# Patient Record
Sex: Male | Born: 1980 | Race: White | Hispanic: No | Marital: Married | Smoking: Never smoker
Health system: Southern US, Community
[De-identification: ages and names within clinical notes are randomized; demographics above are authoritative.]

## PROBLEM LIST (undated history)

## (undated) HISTORY — PX: OTHER SURGICAL HISTORY: SHX169

---

## 2021-01-12 ENCOUNTER — Emergency Department: Payer: Self-pay

## 2021-01-12 ENCOUNTER — Other Ambulatory Visit: Payer: Self-pay

## 2021-01-12 ENCOUNTER — Emergency Department
Admission: EM | Admit: 2021-01-12 | Discharge: 2021-01-12 | Disposition: A | Discharge status: Home / Self Care | Payer: Self-pay | Attending: Emergency Medicine | Admitting: Emergency Medicine

## 2021-01-12 DIAGNOSIS — S61214A Laceration without foreign body of right ring finger without damage to nail, initial encounter: Secondary | ICD-10-CM | POA: Insufficient documentation

## 2021-01-12 DIAGNOSIS — W208XXA Other cause of strike by thrown, projected or falling object, initial encounter: Secondary | ICD-10-CM | POA: Insufficient documentation

## 2021-01-12 DIAGNOSIS — S61216A Laceration without foreign body of right little finger without damage to nail, initial encounter: Secondary | ICD-10-CM

## 2021-01-12 DIAGNOSIS — Y990 Civilian activity done for income or pay: Secondary | ICD-10-CM | POA: Insufficient documentation

## 2021-01-12 MED ORDER — LIDOCAINE HCL 1 % IJ SOLN
5.0000 mL | Freq: Once | INTRAMUSCULAR | Status: AC
  Administered 2021-01-12: 5 mL
  Filled 2021-01-12: qty 10

## 2021-01-12 MED ORDER — CEPHALEXIN 500 MG PO CAPS: 500.0000 mg | ORAL_CAPSULE | Freq: Three times a day (TID) | ORAL | 0 refills | Status: AC

## 2021-01-12 NOTE — Discharge Instructions (Signed)
Take Keflex three times daily for the next seven days.  Have sutures removed for the next seven days.

## 2021-01-12 NOTE — ED Triage Notes (Signed)
Pt was at work lifting a trailer and fell onto right hand, states lac to right 4th finger. No other injury. Pt does not want to file workmans comp at this time.

## 2021-01-13 NOTE — ED Provider Notes (Signed)
[facility]-EMERGENCY DEPARTMENT  ____________________________________________  Time seen: Approximately 5:05 PM  I have reviewed the triage vital signs and the nursing notes.   HISTORY  Chief Complaint Finger Injury   Historian Patient     HPI Taylor Todd is a 40 y.o. male presents to the emergency department with a 1-1/2 cm C-shaped laceration along the volar aspect of the right fourth finger.  No numbness or tingling in the right hand.  Patient reports that he sustained laceration hooking up a trailer.  States that his tetanus status is up-to-date.  No other alleviating measures have been attempted.   No past medical history on file.   Immunizations up to date:  Yes.     No past medical history on file.  There are no problems to display for this patient.    Prior to Admission medications   Medication Sig Start Date End Date Taking? Authorizing Provider  cephALEXin (KEFLEX) 500 MG capsule Take 1 capsule (500 mg total) by mouth 3 (three) times daily for 7 days. 01/12/21 01/19/21 Yes Pia Mau M, PA-C    Allergies Nsaids  No family history on file.  Social History     Review of Systems  Constitutional: No fever/chills Eyes:  No discharge ENT: No upper respiratory complaints. Respiratory: no cough. No SOB/ use of accessory muscles to breath Gastrointestinal:   No nausea, no vomiting.  No diarrhea.  No constipation. Musculoskeletal: Patient has right hand pain.  Skin: Patient has right fourth digit laceration.     ____________________________________________   PHYSICAL EXAM:  VITAL SIGNS: ED Triage Vitals  Enc Vitals Group     BP 01/12/21 2246 (!) 150/116     Pulse Rate 01/12/21 2246 (!) 108     Resp 01/12/21 2246 20     Temp 01/12/21 2246 98.2 F (36.8 C)     Temp Source 01/12/21 2246 Oral     SpO2 01/12/21 2246 97 %     Weight 01/12/21 2243 145 lb (65.8 kg)     Height 01/12/21 2243 5\' 11"  (1.803 m)     Head Circumference --      Peak Flow  --      Pain Score 01/12/21 2243 0     Pain Loc --      Pain Edu? --      Excl. in GC? --      Constitutional: Alert and oriented. Well appearing and in no acute distress. Eyes: Conjunctivae are normal. PERRL. EOMI. Head: Atraumatic. ENT:      Nose: No congestion/rhinnorhea.      Mouth/Throat: Mucous membranes are moist.  Neck: No stridor.  No cervical spine tenderness to palpation.  Cardiovascular: Normal rate, regular rhythm. Normal S1 and S2.  Good peripheral circulation. Respiratory: Normal respiratory effort without tachypnea or retractions. Lungs CTAB. Good air entry to the bases with no decreased or absent breath sounds Gastrointestinal: Bowel sounds x 4 quadrants. Soft and nontender to palpation. No guarding or rigidity. No distention. Musculoskeletal: No flexor or extensor tendon deficits appreciated with right fourth digit testing. Neurologic:  Normal for age. No gross focal neurologic deficits are appreciated.  Skin: Patient has a 1 and half centimeter C-shaped laceration along the volar aspect of the fourth digit.  Capillary refill less than 2 seconds on the right. Psychiatric: Mood and affect are normal for age. Speech and behavior are normal.   ____________________________________________   LABS (all labs ordered are listed, but only abnormal results are displayed)  Labs Reviewed - No  data to display ____________________________________________  EKG   ____________________________________________  RADIOLOGY Geraldo Pitter, personally viewed and evaluated these images (plain radiographs) as part of my medical decision making, as well as reviewing the written report by the radiologist.  DG Hand 2 View Right  Result Date: 01/12/2021 CLINICAL DATA:  Right hand pain. Injury. Laceration to fourth finger. EXAM: RIGHT HAND - 2 VIEW COMPARISON:  None. FINDINGS: There is no evidence of fracture or dislocation. There is no evidence of arthropathy or other focal bone  abnormality. Soft tissue irregularity about the proximal fourth digit. No radiopaque foreign body. IMPRESSION: Soft tissue irregularity about the proximal fourth digit. No osseous abnormality. No radiopaque foreign body. Electronically Signed   By: Narda Rutherford M.D.   On: 01/12/2021 23:18    ____________________________________________    PROCEDURES  Procedure(s) performed:     Marland KitchenMarland KitchenLaceration Repair  Date/Time: 01/13/2021 5:09 PM Performed by: Orvil Feil, PA-C Authorized by: Orvil Feil, PA-C   Consent:    Consent obtained:  Verbal   Consent given by:  Patient   Risks discussed:  Infection and pain Universal protocol:    Procedure explained and questions answered to patient or proxy's satisfaction: yes     Patient identity confirmed:  Verbally with patient Anesthesia:    Anesthesia method:  None Laceration details:    Location:  Finger   Finger location:  R ring finger Pre-procedure details:    Preparation:  Patient was prepped and draped in usual sterile fashion Exploration:    Contaminated: no   Treatment:    Area cleansed with:  Povidone-iodine   Amount of cleaning:  Standard   Irrigation solution:  Sterile saline   Irrigation volume:  500   Visualized foreign bodies/material removed: no   Skin repair:    Repair method:  Sutures   Suture size:  4-0   Suture material:  Nylon   Number of sutures:  6 Approximation:    Approximation:  Close Repair type:    Repair type:  Simple Post-procedure details:    Dressing:  Open (no dressing)   Procedure completion:  Tolerated well, no immediate complications       Medications  lidocaine (XYLOCAINE) 1 % (with pres) injection 5 mL (5 mLs Infiltration Given by Other 01/12/21 2334)     ____________________________________________   INITIAL IMPRESSION / ASSESSMENT AND PLAN / ED COURSE  Pertinent labs & imaging results that were available during my care of the patient were reviewed by me and considered in  my medical decision making (see chart for details).      Assessment and plan Laceration 40 year old male presents to the emergency department with a 1 and half centimeter laceration along the volar aspect of the right fourth digit.  Laceration was repaired in the emergency department without complication.  Was advised to have sutures removed by primary care in 7 days.  He was discharged with Keflex.  Return precautions were given to return with new or worsening symptoms.     ____________________________________________  FINAL CLINICAL IMPRESSION(S) / ED DIAGNOSES  Final diagnoses:  Laceration of right little finger without foreign body without damage to nail, initial encounter      NEW MEDICATIONS STARTED DURING THIS VISIT:  ED Discharge Orders         Ordered    cephALEXin (KEFLEX) 500 MG capsule  3 times daily        01/12/21 2335  This chart was dictated using voice recognition software/Dragon. Despite best efforts to proofread, errors can occur which can change the meaning. Any change was purely unintentional.     Orvil Feil, PA-C 01/13/21 1710    Delton Prairie, MD 01/13/21 2034

## 2021-12-29 NOTE — Progress Notes (Deleted)
? ?  I,Taylor Todd,acting as a Neurosurgeon for [employer], PA-C.,have documented all relevant documentation on the behalf of Taylor Lat, PA-C,as directed by  [employer], PA-C while in the presence of [employer], PA-C. ? ?New Patient Office Visit ? ?Subjective   ? ?Patient ID: Taylor Todd, male    DOB: 07-May-1981  Age: 41 y.o. MRN: [id number] ? ?CC: No chief complaint on file. ? ? ?HPI ?Taylor Todd presents  ? ?No outpatient encounter medications on file as of 01/01/2022.  ? ?No facility-administered encounter medications on file as of 01/01/2022.  ? ? ?No past medical history on file. ? ?*** The histories are not reviewed yet. Please review them in the "History" navigator section and refresh this SmartLink. ? ?No family history on file. ? ?Social History  ? ?Socioeconomic History  ? Marital status: Married  ?  Spouse name: Not on file  ? Number of children: Not on file  ? Years of education: Not on file  ? Highest education level: Not on file  ?Occupational History  ? Not on file  ?Tobacco Use  ? Smoking status: Not on file  ? Smokeless tobacco: Not on file  ?Substance and Sexual Activity  ? Alcohol use: Not on file  ? Drug use: Not on file  ? Sexual activity: Not on file  ?Other Topics Concern  ? Not on file  ?Social History Narrative  ? Not on file  ? ?Social Determinants of Health  ? ?Financial Resource Strain: Not on file  ?Food Insecurity: Not on file  ?Transportation Needs: Not on file  ?Physical Activity: Not on file  ?Stress: Not on file  ?Social Connections: Not on file  ?Intimate Partner Violence: Not on file  ? ? ?ROS ? ?  ? ? ?Objective   ? ?There were no vitals taken for this visit. ? ?Physical Exam ? ?{Labs (Optional):23779} ?  ? ?Assessment & Plan:  ? ?Problem List Items Addressed This Visit   ?None ? ? ?No follow-ups on file.  ? ?Sarina Ill, CMA ? ? ?

## 2022-01-01 ENCOUNTER — Encounter: Payer: Self-pay | Admitting: Physician Assistant

## 2022-01-01 ENCOUNTER — Ambulatory Visit (INDEPENDENT_AMBULATORY_CARE_PROVIDER_SITE_OTHER): Payer: 59 | Admitting: Physician Assistant

## 2022-01-01 VITALS — BP 134/86 | HR 83 | Temp 98.2°F | Resp 16 | Ht 70.0 in | Wt 155.7 lb

## 2022-01-01 DIAGNOSIS — B351 Tinea unguium: Secondary | ICD-10-CM | POA: Diagnosis not present

## 2022-01-01 DIAGNOSIS — Z0000 Encounter for general adult medical examination without abnormal findings: Secondary | ICD-10-CM | POA: Diagnosis not present

## 2022-01-01 DIAGNOSIS — B353 Tinea pedis: Secondary | ICD-10-CM | POA: Diagnosis not present

## 2022-01-01 NOTE — Progress Notes (Signed)
? ? ?I,Zaylei Mullane Robinson,acting as a Education administrator for [employer], PA-C.,have documented all relevant documentation on the behalf of Mardene Speak, PA-C,as directed by  [employer], PA-C while in the presence of [employer], PA-C. ? ? ?Complete physical exam ? ? ?Patient: Taylor Todd   DOB: 1981/03/05   41 y.o. Male  MRN: [id number] ?Visit Date: 01/01/2022 ? ?Today's healthcare provider: Mardene Speak, PA-C  ? ?Chief Complaint  ?Patient presents with  ? Annual Exam  ? ?Subjective  ?  ?Taylor Todd is a 41 y.o. male who presents today for a complete physical exam.  ?He reports consuming a general diet. The patient has a physically strenuous job, but has no regular exercise apart from work.  He generally feels well. He reports sleeping well. He does not have additional problems to discuss today. Patient has no complains.   ? ? ?History reviewed. No pertinent past medical history. ?Past Surgical History:  ?Procedure Laterality Date  ? none    ? ?Social History  ? ?Socioeconomic History  ? Marital status: Married  ?  Spouse name: Not on file  ? Number of children: Not on file  ? Years of education: Not on file  ? Highest education level: Not on file  ?Occupational History  ? Not on file  ?Tobacco Use  ? Smoking status: Never  ? Smokeless tobacco: Not on file  ?Vaping Use  ? Vaping Use: Never used  ?Substance and Sexual Activity  ? Alcohol use: Never  ?  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 Drug use: Never  ? Sexual activity: Yes  ?Other Topics Concern  ? Not on file  ?Social History Narrative  ? Not on file  ? ?Social Determinants of Health  ? ?Financial Resource Strain: Not on file  ?Food Insecurity: Not on file  ?Transportation Needs: Not on file  ?Physical Activity: Not on file  ?Stress: Not on file  ?Social Connections: Not on file  ?Intimate Partner Violence: Not on file  ? ?Family Status  ?Relation Name Status  ? Father  (Not Specified)  ? ?Family History  ?Problem Relation Age of Onset  ?  Diabetes Father   ? ?Allergies  ?Allergen Reactions  ? Nsaids Itching  ?  ?Patient Care Team: ?Elberta Leatherwood as PCP - General (Physician Assistant)  ? ?Medications: ?No outpatient medications prior to visit.  ? ?No facility-administered medications prior to visit.  ? ? ?Review of Systems  ?Allergic/Immunologic: Positive for environmental allergies.  ?All other systems reviewed and are negative. ? ? ? Objective  ? ?  ?BP 134/86 (BP Location: Right Arm, Patient Position: Sitting, Cuff Size: Normal)   Pulse 83   Temp 98.2 ?F (36.8 ?C) (Oral)   Resp 16   Ht 5\' 10"  (1.778 m)   Wt 155 lb 11.2 oz (70.6 kg)   SpO2 100%   BMI 22.34 kg/m?  ? ? ? ?Physical Exam ?Vitals reviewed.  ?Constitutional:   ?   General: He is not in acute distress. ?   Appearance: Normal appearance. He is normal weight.  ?HENT:  ?   Head: Normocephalic and atraumatic.  ?   Right Ear: Tympanic membrane normal. There is impacted cerumen (partially).  ?   Left Ear: Tympanic membrane normal. There is impacted cerumen (partially).  ?   Nose:  ?   Comments: Deviated septum ?   Mouth/Throat:  ?   Mouth: Mucous membranes are moist.  ?   Pharynx: Oropharynx  is clear.  ?Eyes:  ?   Extraocular Movements: Extraocular movements intact.  ?   Pupils: Pupils are equal, round, and reactive to light.  ?Cardiovascular:  ?   Rate and Rhythm: Normal rate and regular rhythm.  ?   Pulses: Normal pulses.  ?   Heart sounds: Normal heart sounds.  ?Pulmonary:  ?   Effort: Pulmonary effort is normal.  ?   Breath sounds: Normal breath sounds.  ?Abdominal:  ?   General: Abdomen is flat. Bowel sounds are normal.  ?   Palpations: Abdomen is soft.  ?Musculoskeletal:  ?   Right lower leg: No edema.  ?   Left lower leg: No edema.  ?Skin: ?   Findings: Rash present.  ?   Comments: A diffuse, hyperkeratotic eruption involving the soles and medial and lateral surfaces of the feet, resembling a "moccasin" distribution , bilaterally ?White, yellow, or brown discoloration of the  distal corner of the toe nails bilaterally ? ?Tinea pedis/onychomycosis of toes bilaterally  ?Neurological:  ?   General: No focal deficit present.  ?   Mental Status: He is alert and oriented to person, place, and time.  ?   Cranial Nerves: No cranial nerve deficit.  ?   Sensory: No sensory deficit.  ?   Motor: No weakness.  ?   Coordination: Coordination normal.  ?   Gait: Gait normal.  ?   Deep Tendon Reflexes: Reflexes normal.  ?Psychiatric:     ?   Behavior: Behavior normal.     ?   Thought Content: Thought content normal.     ?   Judgment: Judgment normal.  ?  ? ?Last depression screening scores ? ?  01/01/2022  ?  1:51 PM  ?PHQ 2/9 Scores  ?PHQ - 2 Score 0  ?PHQ- 9 Score 0  ? ?Last fall risk screening ? ?  01/01/2022  ?  1:50 PM  ?Fall Risk   ?Falls in the past year? 0  ?Number falls in past yr: 0  ?Injury with Fall? 0  ?Follow up Falls evaluation completed  ? ?Last Audit-C alcohol use screening ? ?  01/01/2022  ?  1:51 PM  ?Alcohol Use Disorder Test (AUDIT)  ?1. How often do you have a drink containing alcohol? 0  ?2. How many drinks containing alcohol do you have on a typical day when you are drinking? 0  ?3. How often do you have six or more drinks on one occasion? 0  ?AUDIT-C Score 0  ? ?A score of 3 or more in women, and 4 or more in men indicates increased risk for alcohol abuse, EXCEPT if all of the points are from question 1  ? ?No results found for any visits on 01/01/22. ? Assessment & Plan  ?  ?Routine Health Maintenance and Physical Exam ? ?Exercise Activities and Dietary recommendations ? Goals   ?None ?  ? ?  ?Annual wellness visit done today including the all of the following: ?Reviewed patient's Family Medical History ?Reviewed and updated list of patient's medical providers ?Assessment of cognitive impairment was done ?Assessed patient's functional ability ?Established a written schedule for health screening services ?Health Risk Assessent Completed and Reviewed ?   ? ?There is no immunization  history on file for this patient. ? ?Health Maintenance  ?Topic Date Due  ? Hepatitis C Screening  02/01/2022 (Originally 07/10/1999)  ? TETANUS/TDAP  01/02/2023 (Originally 07/09/2000)  ? HIV Screening  01/02/2023 (Originally 07/09/1996)  ? INFLUENZA VACCINE  04/03/2022  ? HPV VACCINES  Aged Out  ? ? ?Discussed health benefits of physical activity, and encouraged him to engage in regular exercise appropriate for his age and condition. ? ?1. Annual physical exam ? ?- CBC with Differential/Platelet ?- Comprehensive metabolic panel ?- Hemoglobin A1c ?- Lipid panel ?- PSA Total (Reflex To Free) ? ?2. Hyperkeratotic tinea pedis of both feet ?Discussed treatment options of the condition ?Advised to use antiseptic soaks and topical antifungals ?Informed that there is an option for systemic treatment but patient declined due to financial burden ? ?3. Onychomycosis of both feet ?Patient advised to see Podiatry if insurance will cover services ? ?The patient was advised to call back or seek an in-person evaluation if the symptoms worsen or if the condition fails to improve as anticipated. ? ?I discussed the assessment and treatment plan with the patient. The patient was provided an opportunity to ask questions and all were answered. The patient agreed with the plan and demonstrated an understanding of the instructions. ? ?The entirety of the information documented in the History of Present Illness, Review of Systems and Physical Exam were personally obtained by me. Portions of this information were initially documented by the CMA and reviewed by me for thoroughness and accuracy.   ? ?Portions of this note were created using dictation software and may contain typographical errors.   ? ? ?Mardene Speak, PA-C  ?[place] ?[phone] (phone) ?[phone] (fax) ? ?[facility] ?

## 2022-01-03 ENCOUNTER — Encounter: Payer: Self-pay | Admitting: Physician Assistant

## 2022-01-03 LAB — COMPREHENSIVE METABOLIC PANEL
ALT: 50 IU/L — ABNORMAL HIGH (ref 0–44)
AST: 35 IU/L (ref 0–40)
Albumin/Globulin Ratio: 2.2 (ref 1.2–2.2)
Albumin: 4.8 g/dL (ref 4.0–5.0)
Alkaline Phosphatase: 89 IU/L (ref 44–121)
BUN/Creatinine Ratio: 19 (ref 9–20)
BUN: 14 mg/dL (ref 6–24)
Bilirubin Total: 0.2 mg/dL (ref 0.0–1.2)
CO2: 26 mmol/L (ref 20–29)
Calcium: 9.9 mg/dL (ref 8.7–10.2)
Chloride: 104 mmol/L (ref 96–106)
Creatinine, Ser: 0.73 mg/dL — ABNORMAL LOW (ref 0.76–1.27)
Globulin, Total: 2.2 g/dL (ref 1.5–4.5)
Glucose: 100 mg/dL — ABNORMAL HIGH (ref 70–99)
Potassium: 4.6 mmol/L (ref 3.5–5.2)
Sodium: 140 mmol/L (ref 134–144)
Total Protein: 7 g/dL (ref 6.0–8.5)
eGFR: 118 mL/min/{1.73_m2} (ref >59)

## 2022-01-03 LAB — CBC WITH DIFFERENTIAL/PLATELET
Basophils Absolute: 0 10*3/uL (ref 0.0–0.2)
Basos: 0 %
EOS (ABSOLUTE): 0.2 10*3/uL (ref 0.0–0.4)
Eos: 3 %
Hematocrit: 44.5 % (ref 37.5–51.0)
Hemoglobin: 15.4 g/dL (ref 13.0–17.7)
Immature Grans (Abs): 0 10*3/uL (ref 0.0–0.1)
Immature Granulocytes: 0 %
Lymphocytes Absolute: 1.8 10*3/uL (ref 0.7–3.1)
Lymphs: 26 %
MCH: 29.7 pg (ref 26.6–33.0)
MCHC: 34.6 g/dL (ref 31.5–35.7)
MCV: 86 fL (ref 79–97)
Monocytes Absolute: 0.7 10*3/uL (ref 0.1–0.9)
Monocytes: 10 %
Neutrophils Absolute: 4.3 10*3/uL (ref 1.4–7.0)
Neutrophils: 61 %
Platelets: 204 10*3/uL (ref 150–450)
RBC: 5.19 x10E6/uL (ref 4.14–5.80)
RDW: 12 % (ref 11.6–15.4)
WBC: 7.1 10*3/uL (ref 3.4–10.8)

## 2022-01-03 LAB — HEMOGLOBIN A1C
Est. average glucose Bld gHb Est-mCnc: 117 mg/dL
Hgb A1c MFr Bld: 5.7 % — ABNORMAL HIGH (ref 4.8–5.6)

## 2022-01-03 LAB — LIPID PANEL
Chol/HDL Ratio: 4.7 ratio (ref 0.0–5.0)
Cholesterol, Total: 178 mg/dL (ref 100–199)
HDL: 38 mg/dL — ABNORMAL LOW (ref >39)
LDL Chol Calc (NIH): 117 mg/dL — ABNORMAL HIGH (ref 0–99)
Triglycerides: 130 mg/dL (ref 0–149)
VLDL Cholesterol Cal: 23 mg/dL (ref 5–40)

## 2022-01-03 LAB — PSA TOTAL (REFLEX TO FREE): Prostate Specific Ag, Serum: 0.6 ng/mL (ref 0.0–4.0)

## 2022-04-05 IMAGING — DX DG HAND 2V*R*
2 series · 2 of 2 positions shown · non-contrast
Comparison: None.

CLINICAL DATA: Right hand pain. Injury. Laceration to fourth
finger.

EXAM:
RIGHT HAND - 2 VIEW

[hand ap]
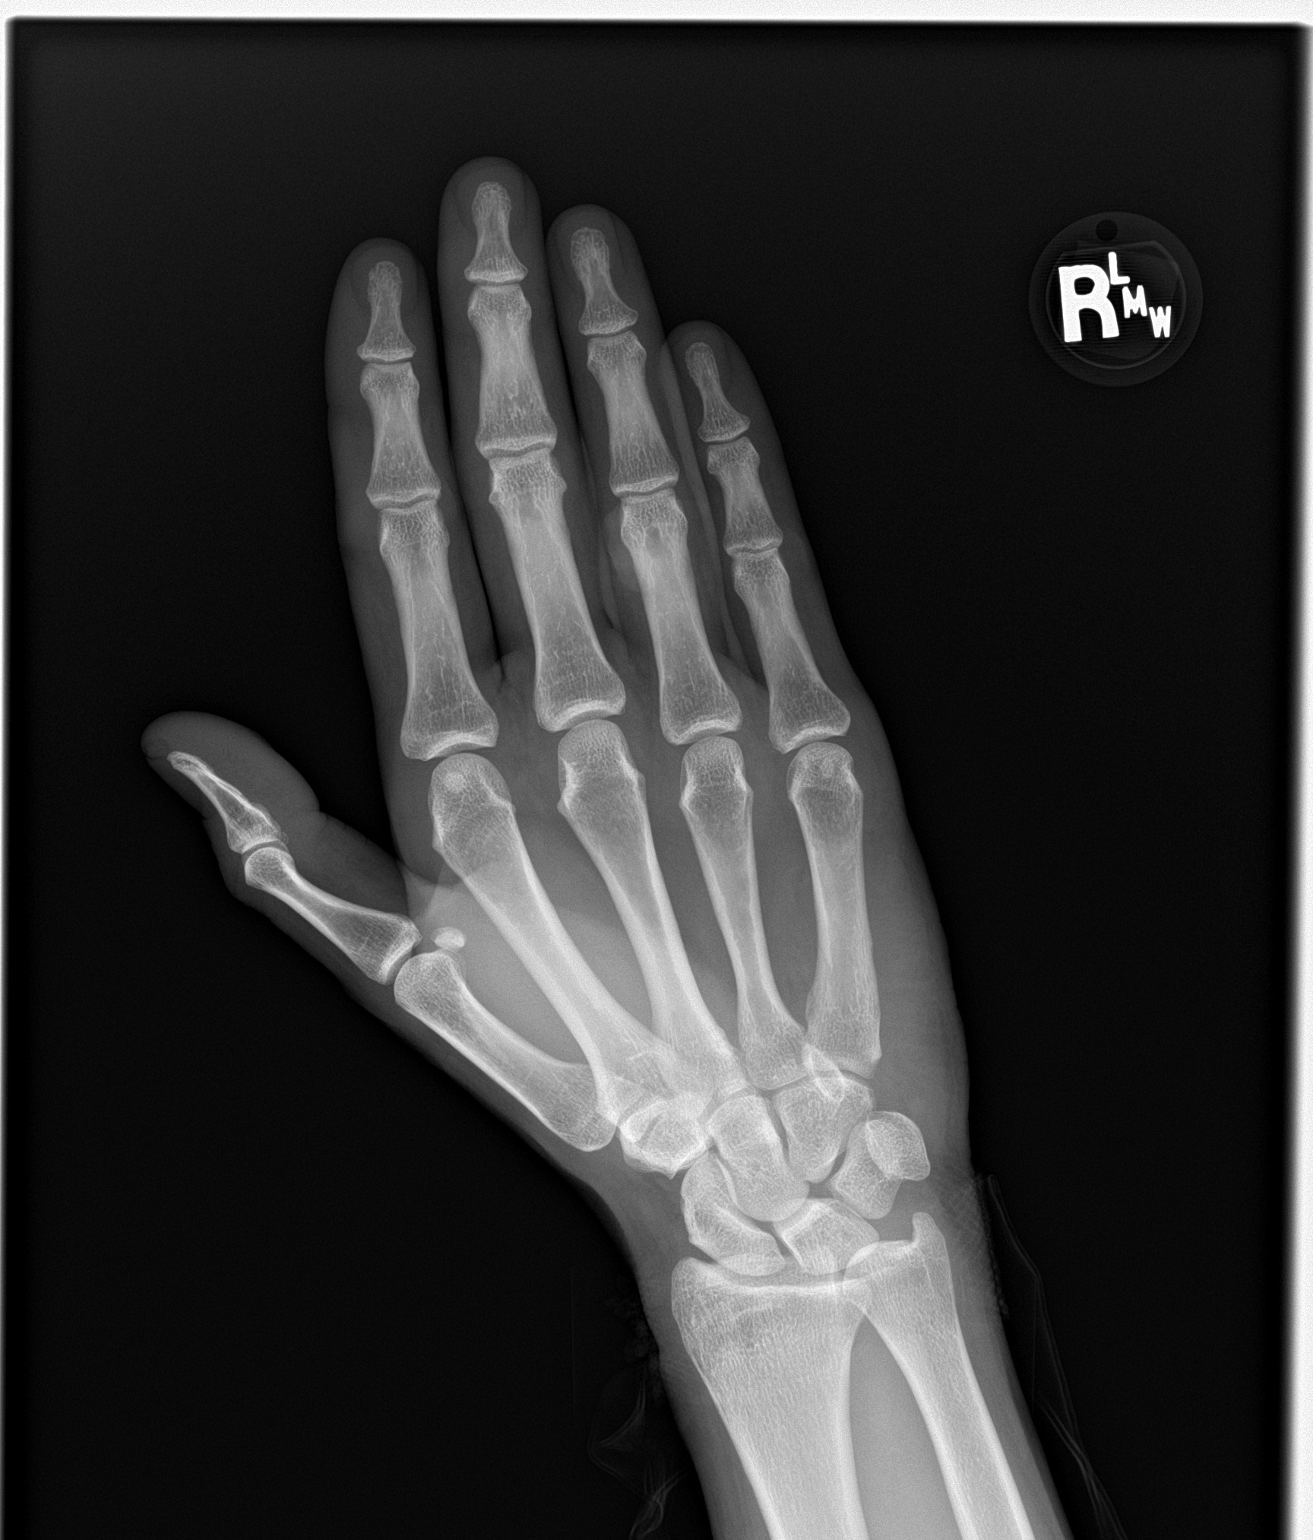

[hand lat]
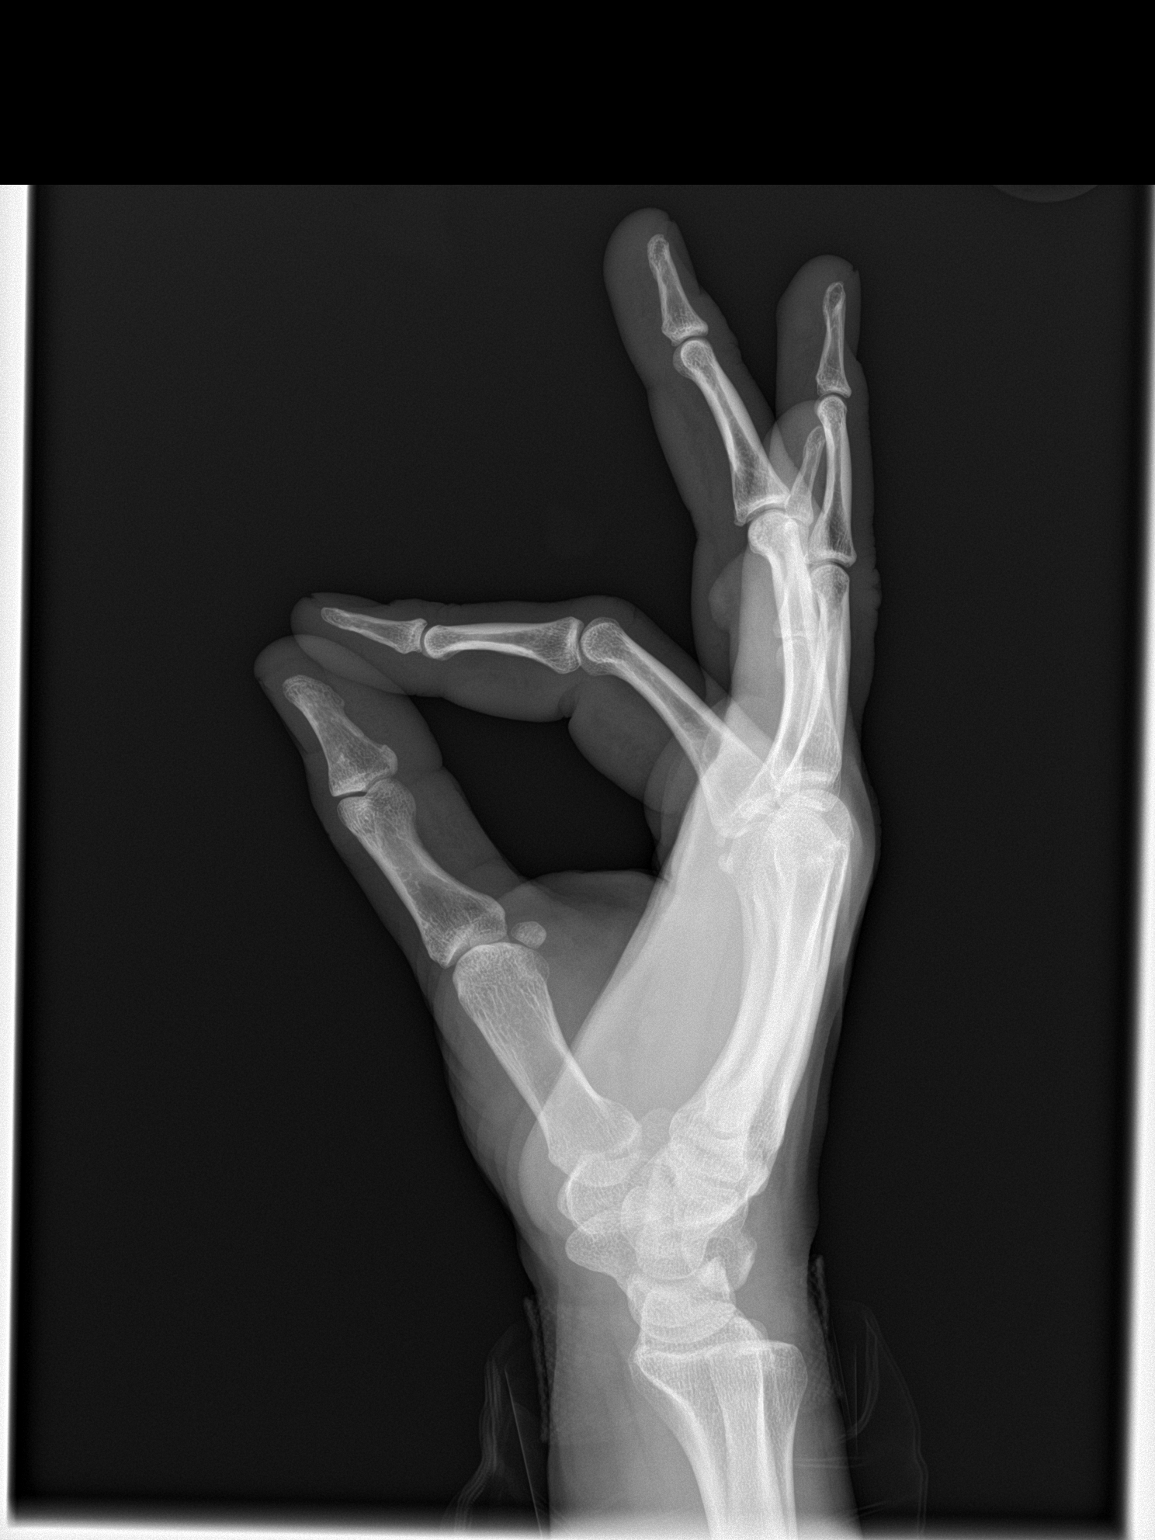

[2 of 2 positions shown; findings below may reference images not displayed]

FINDINGS: There is no evidence of fracture or dislocation. There is no
evidence of arthropathy or other focal bone abnormality. Soft tissue
irregularity about the proximal fourth digit. No radiopaque foreign
body.
IMPRESSION: Soft tissue irregularity about the proximal fourth digit. No osseous
abnormality. No radiopaque foreign body.
